# Patient Record
Sex: Female | Born: 2000
Health system: Southern US, Community
[De-identification: ages and names within clinical notes are randomized; demographics above are authoritative.]

## PROBLEM LIST (undated history)

## (undated) DIAGNOSIS — Z00129 Encounter for routine child health examination without abnormal findings: Secondary | ICD-10-CM

## (undated) DIAGNOSIS — H6691 Otitis media, unspecified, right ear: Secondary | ICD-10-CM

## (undated) HISTORY — DX: Otitis media, unspecified, right ear: H66.91

## (undated) HISTORY — DX: Encounter for routine child health examination without abnormal findings: Z00.129

---
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
## 2021-06-14 ENCOUNTER — Encounter: Payer: Commercial Managed Care - PPO | Admitting: Family Medicine

## 2021-08-04 ENCOUNTER — Ambulatory Visit (INDEPENDENT_AMBULATORY_CARE_PROVIDER_SITE_OTHER): Payer: Commercial Managed Care - PPO | Admitting: Family Medicine

## 2021-08-04 ENCOUNTER — Encounter: Payer: Self-pay | Admitting: Family Medicine

## 2021-08-04 ENCOUNTER — Other Ambulatory Visit (HOSPITAL_COMMUNITY)
Admission: RE | Admit: 2021-08-04 | Discharge: 2021-08-04 | Disposition: A | Discharge status: Home / Self Care | Payer: Commercial Managed Care - PPO | Source: Ambulatory Visit | Attending: Family Medicine | Admitting: Family Medicine

## 2021-08-04 VITALS — BP 122/76 | HR 54 | Temp 97.6°F | Ht 65.0 in | Wt 132.0 lb

## 2021-08-04 DIAGNOSIS — Z113 Encounter for screening for infections with a predominantly sexual mode of transmission: Secondary | ICD-10-CM

## 2021-08-04 DIAGNOSIS — N926 Irregular menstruation, unspecified: Secondary | ICD-10-CM

## 2021-08-04 DIAGNOSIS — Z0000 Encounter for general adult medical examination without abnormal findings: Secondary | ICD-10-CM | POA: Diagnosis not present

## 2021-08-04 LAB — IBC + FERRITIN
Ferritin: 13.8 ng/mL (ref 10.0–291.0)
Iron: 130 ug/dL (ref 42–145)
Saturation Ratios: 31.9 % (ref 20.0–50.0)
TIBC: 407.4 ug/dL (ref 250.0–450.0)
Transferrin: 291 mg/dL (ref 212.0–360.0)

## 2021-08-04 LAB — TSH: TSH: 1.07 u[IU]/mL (ref 0.35–5.50)

## 2021-08-04 LAB — CBC
HCT: 42.5 % (ref 36.0–46.0)
Hemoglobin: 13.8 g/dL (ref 12.0–15.0)
MCHC: 32.5 g/dL (ref 30.0–36.0)
MCV: 91.9 fl (ref 78.0–100.0)
Platelets: 307 10*3/uL (ref 150.0–400.0)
RBC: 4.62 Mil/uL (ref 3.87–5.11)
RDW: 12.6 % (ref 11.5–14.6)
WBC: 5.2 10*3/uL (ref 4.5–10.5)

## 2021-08-04 NOTE — Progress Notes (Signed)
? ? ?Patient ID: Jasmine Conner, female  DOB: [birth year], 21 y.o.   MRN: [id number] ?Patient Care Team  ?  Relationship Specialty Notifications Start End  ?Ma Hillock, DO PCP - General Family Medicine  07/09/15   ? Comment: wants closer location  ? ? ?Chief Complaint  ?Patient presents with  ? Annual Exam  ?  Pt is fasting  ? ? ?Subjective: ? ?Jasmine Conner is a 21 y.o.  Female  present for CPE  ?All past medical history, surgical history, allergies, family history, immunizations, medications and social history were updated in the electronic medical record today. ?All recent labs, ED visits and hospitalizations within the last year were reviewed. ? ?Health maintenance:  ?Colonoscopy:no fhx. Routine screen at 85  ?Mammogram: no fhx. routine screen at 40 ?Cervical cancer screening: Pap at 21. ?Immunizations: tdap 09/2012, Influenza  (encouraged yearly), covid completed ?Infectious disease screening: HIV and Hep C completed ?DEXA:routine screen ?Assistive device: none ?Oxygen PJK:DTOI ?Patient has a Dental home. ?Hospitalizations/ED visits: reviewed ? ?In college [employer]. Sh is l double majoring in chemistry and spanish- still wants to be a Software engineer. Leaving for study abroad in [place] for 6 weeks.  ? ? ?  08/04/2021  ?  9:28 AM 06/11/2020  ?  9:25 AM 03/09/2020  ? 10:26 AM 01/11/2019  ?  3:37 PM 01/03/2018  ?  2:27 PM  ?Depression screen PHQ 2/9  ?Decreased Interest 0 0 0 0 0  ?Down, Depressed, Hopeless 1 0 0 0 0  ?PHQ - 2 Score 1 0 0 0 0  ? ?   ? View : No data to display.  ?  ?  ?  ? ? ? ?Immunization History  ?Administered Date(s) Administered  ? DTaP 10/03/2001, 12/03/2001, 02/04/2002, 01/28/2003, 08/15/2006  ? HPV 9-valent 01/11/2019, 02/15/2019, 10/14/2019  ? Hepatitis B 02-15-02, 10/03/2001, 12/03/2001  ? HiB (PRP-OMP) 10/03/2001, 12/03/2001, 01/28/2003, 07/29/2003  ? IPV 10/03/2001, 12/03/2001, 09/20/2002, 08/15/2006  ? Influenza Split 04/19/2011  ? Influenza,inj,Quad PF,6+ Mos 12/14/2012, 04/14/2014,  01/26/2015, 01/01/2016, 01/27/2017, 01/03/2018, 12/17/2018, 03/09/2020  ? Influenza-Unspecified 01/28/2003  ? MMR 02/15/2003, 08/15/2006  ? Meningococcal Conjugate 11/04/2013  ? Meningococcal Mcv4o 01/03/2018  ? PFIZER(Purple Top)SARS-COV-2 Vaccination 08/02/2019, 08/22/2019, 03/25/2020  ? Pneumococcal Conjugate-13 10/03/2001, 12/03/2001, 05/01/2002, 02/18/2003  ? Tdap 10/15/2012  ? Varicella 09/20/2002, 08/15/2006  ? ? ? ?Past Medical History:  ?Diagnosis Date  ? ROM (right otitis media) 04/04/2012  ? ?No Known Allergies ?History reviewed. No pertinent surgical history. ?Family History  ?Problem Relation Age of Onset  ? Hepatitis Paternal Grandmother   ?     60's- hepatitis from blood transfusion  ? Hypertension Paternal Grandfather   ? Sudden death Neg Hx   ? Hyperlipidemia Neg Hx   ? Heart attack Neg Hx   ? Diabetes Neg Hx   ? ?Social History  ? ?Social History Narrative  ? Not on file  ? ? ?Allergies as of 08/04/2021   ?No Known Allergies ?  ? ?  ?Medication List  ?  ? ?  ? Accurate as of Aug 04, 2021  9:56 AM. If you have any questions, ask your nurse or doctor.  ?  ?  ? ?  ? ?STOP taking these medications   ? ?norgestimate-ethinyl estradiol 0.25-35 MG-MCG tablet ?Commonly known as: Sprintec 28 ?Stopped by: Howard Pouch, DO ?  ? ?  ? ?TAKE these medications   ? ?levocetirizine 5 MG tablet ?Commonly known as: XYZAL ?TAKE 1 TABLET BY MOUTH EVERY DAY  IN THE EVENING ?  ?mometasone 50 MCG/ACT nasal spray ?Commonly known as: NASONEX ?Place 2 sprays into the nose daily. ?  ? ?  ? ? ?All past medical history, surgical history, allergies, family history, immunizations andmedications were updated in the EMR today and reviewed under the history and medication portions of their EMR.    ? ?No results found for this or any previous visit (from the past 2160 hour(s)). ? ?No results found. ? ? ?ROS ?14 pt review of systems performed and negative (unless mentioned in an HPI) ?Objective: ?BP 122/76   Pulse (!) 54   Temp 97.6 ?F  (36.4 ?C) (Oral)   Ht $R'5\' 5"'VE$  (1.651 m)   Wt 132 lb (59.9 kg)   LMP 08/02/2021   SpO2 98%   BMI 21.97 kg/m?  ?Physical Exam ?Vitals and nursing note reviewed.  ?Constitutional:   ?   General: She is not in acute distress. ?   Appearance: Normal appearance. She is not ill-appearing or toxic-appearing.  ?HENT:  ?   Head: Normocephalic and atraumatic.  ?   Right Ear: Tympanic membrane, ear canal and external ear normal. There is no impacted cerumen.  ?   Left Ear: Tympanic membrane, ear canal and external ear normal. There is no impacted cerumen.  ?   Nose: No congestion or rhinorrhea.  ?   Mouth/Throat:  ?   Mouth: Mucous membranes are moist.  ?   Pharynx: Oropharynx is clear. No oropharyngeal exudate or posterior oropharyngeal erythema.  ?Eyes:  ?   General: No scleral icterus.    ?   Right eye: No discharge.     ?   Left eye: No discharge.  ?   Extraocular Movements: Extraocular movements intact.  ?   Conjunctiva/sclera: Conjunctivae normal.  ?   Pupils: Pupils are equal, round, and reactive to light.  ?Cardiovascular:  ?   Rate and Rhythm: Normal rate and regular rhythm.  ?   Pulses: Normal pulses.  ?   Heart sounds: Normal heart sounds. No murmur heard. ?  No friction rub. No gallop.  ?Pulmonary:  ?   Effort: Pulmonary effort is normal. No respiratory distress.  ?   Breath sounds: Normal breath sounds. No stridor. No wheezing, rhonchi or rales.  ?Chest:  ?   Chest wall: No tenderness.  ?Abdominal:  ?   General: Abdomen is flat. Bowel sounds are normal. There is no distension.  ?   Palpations: Abdomen is soft. There is no mass.  ?   Tenderness: There is no abdominal tenderness. There is no right CVA tenderness, left CVA tenderness, guarding or rebound.  ?   Hernia: No hernia is present.  ?Musculoskeletal:     ?   General: No swelling, tenderness or deformity. Normal range of motion.  ?   Cervical back: Normal range of motion and neck supple. No rigidity or tenderness.  ?   Right lower leg: No edema.  ?   Left  lower leg: No edema.  ?Lymphadenopathy:  ?   Cervical: No cervical adenopathy.  ?Skin: ?   General: Skin is warm and dry.  ?   Coloration: Skin is not jaundiced or pale.  ?   Findings: No bruising, erythema, lesion or rash.  ?Neurological:  ?   General: No focal deficit present.  ?   Mental Status: She is alert and oriented to person, place, and time. Mental status is at baseline.  ?   Cranial Nerves: No cranial nerve deficit.  ?  Sensory: No sensory deficit.  ?   Motor: No weakness.  ?   Coordination: Coordination normal.  ?   Gait: Gait normal.  ?   Deep Tendon Reflexes: Reflexes normal.  ?Psychiatric:     ?   Mood and Affect: Mood normal.     ?   Behavior: Behavior normal.     ?   Thought Content: Thought content normal.     ?   Judgment: Judgment normal.  ?  ? ?No results found. ? ?Assessment/plan: ?Tynika Luddy is a 21 y.o. female present for CPE ?Menstrual periods irregular ?Safe sex  ?- CBC ?- TSH ?- IBC + Ferritin ? ?Screen for STD (sexually transmitted disease) ?- Urine cytology ancillary only() ? ?Routine general medical examination at a health care facility ?Colonoscopy:no fhx. Routine screen at 62  ?Mammogram: no fhx. routine screen at 41 ?Cervical cancer screening: Pap at 22. ?Immunizations: tdap 09/2012, Influenza  (encouraged yearly), covid completed ?Infectious disease screening: HIV and Hep C completed ?DEXA:routine screen ?Patient was encouraged to exercise greater than 150 minutes a week. Patient was encouraged to choose a diet filled with fresh fruits and vegetables, and lean meats. ?AVS provided to patient today for education/recommendation on gender specific health and safety maintenance. ? ?Return in about 1 year (around 08/06/2022) for cpe (20 min). ? ? ?Orders Placed This Encounter  ?Procedures  ? CBC  ? TSH  ? IBC + Ferritin  ? ? ?Orders Placed This Encounter  ?Procedures  ? CBC  ? TSH  ? IBC + Ferritin  ? ?No orders of the defined types were placed in this encounter. ? ?Referral  Orders  ?No referral(s) requested today  ? ? ? ?Electronically signed by: ?Howard Pouch, DO ?[facility] ?

## 2021-08-04 NOTE — Patient Instructions (Signed)
No follow-ups on file.        Great to see you today.  I have refilled the medication(s) we provide.   If labs were collected, we will inform you of lab results once received either by echart message or telephone call.   - echart message- for normal results that have been seen by the patient already.   - telephone call: abnormal results or if patient has not viewed results in their echart.  Health Maintenance, Female Adopting a healthy lifestyle and getting preventive care are important in promoting health and wellness. Ask your health care provider about: The right schedule for you to have regular tests and exams. Things you can do on your own to prevent diseases and keep yourself healthy. What should I know about diet, weight, and exercise? Eat a healthy diet  Eat a diet that includes plenty of vegetables, fruits, low-fat dairy products, and lean protein. Do not eat a lot of foods that are high in solid fats, added sugars, or sodium. Maintain a healthy weight Body mass index (BMI) is used to identify weight problems. It estimates body fat based on height and weight. Your health care provider can help determine your BMI and help you achieve or maintain a healthy weight. Get regular exercise Get regular exercise. This is one of the most important things you can do for your health. Most adults should: Exercise for at least 150 minutes each week. The exercise should increase your heart rate and make you sweat (moderate-intensity exercise). Do strengthening exercises at least twice a week. This is in addition to the moderate-intensity exercise. Spend less time sitting. Even light physical activity can be beneficial. Watch cholesterol and blood lipids Have your blood tested for lipids and cholesterol at 20 years of age, then have this test every 5 years. Have your cholesterol levels checked more often if: Your lipid or cholesterol levels are high. You are older than 20 years of  age. You are at high risk for heart disease. What should I know about cancer screening? Depending on your health history and family history, you may need to have cancer screening at various ages. This may include screening for: Breast cancer. Cervical cancer. Colorectal cancer. Skin cancer. Lung cancer. What should I know about heart disease, diabetes, and high blood pressure? Blood pressure and heart disease High blood pressure causes heart disease and increases the risk of stroke. This is more likely to develop in people who have high blood pressure readings or are overweight. Have your blood pressure checked: Every 3-5 years if you are 18-39 years of age. Every year if you are 40 years old or older. Diabetes Have regular diabetes screenings. This checks your fasting blood sugar level. Have the screening done: Once every three years after age 40 if you are at a normal weight and have a low risk for diabetes. More often and at a younger age if you are overweight or have a high risk for diabetes. What should I know about preventing infection? Hepatitis B If you have a higher risk for hepatitis B, you should be screened for this virus. Talk with your health care provider to find out if you are at risk for hepatitis B infection. Hepatitis C Testing is recommended for: Everyone born from 1945 through 1965. Anyone with known risk factors for hepatitis C. Sexually transmitted infections (STIs) Get screened for STIs, including gonorrhea and chlamydia, if: You are sexually active and are younger than 20 years of age. You are   older than 20 years of age and your health care provider tells you that you are at risk for this type of infection. Your sexual activity has changed since you were last screened, and you are at increased risk for chlamydia or gonorrhea. Ask your health care provider if you are at risk. Ask your health care provider about whether you are at high risk for HIV. Your health  care provider may recommend a prescription medicine to help prevent HIV infection. If you choose to take medicine to prevent HIV, you should first get tested for HIV. You should then be tested every 3 months for as long as you are taking the medicine. Pregnancy If you are about to stop having your period (premenopausal) and you may become pregnant, seek counseling before you get pregnant. Take 400 to 800 micrograms (mcg) of folic acid every day if you become pregnant. Ask for birth control (contraception) if you want to prevent pregnancy. Osteoporosis and menopause Osteoporosis is a disease in which the bones lose minerals and strength with aging. This can result in bone fractures. If you are 65 years old or older, or if you are at risk for osteoporosis and fractures, ask your health care provider if you should: Be screened for bone loss. Take a calcium or vitamin D supplement to lower your risk of fractures. Be given hormone replacement therapy (HRT) to treat symptoms of menopause. Follow these instructions at home: Alcohol use Do not drink alcohol if: Your health care provider tells you not to drink. You are pregnant, may be pregnant, or are planning to become pregnant. If you drink alcohol: Limit how much you have to: 0-1 drink a day. Know how much alcohol is in your drink. In the [place] one drink equals one 12 oz bottle of beer (355 mL), one 5 oz glass of wine (148 mL), or one 1 oz glass of hard liquor (44 mL). Lifestyle Do not use any products that contain nicotine or tobacco. These products include cigarettes, chewing tobacco, and vaping devices, such as e-cigarettes. If you need help quitting, ask your health care provider. Do not use street drugs. Do not share needles. Ask your health care provider for help if you need support or information about quitting drugs. General instructions Schedule regular health, dental, and eye exams. Stay current with your vaccines. Tell your health  care provider if: You often feel depressed. You have ever been abused or do not feel safe at home. Summary Adopting a healthy lifestyle and getting preventive care are important in promoting health and wellness. Follow your health care provider's instructions about healthy diet, exercising, and getting tested or screened for diseases. Follow your health care provider's instructions on monitoring your cholesterol and blood pressure. This information is not intended to replace advice given to you by your health care provider. Make sure you discuss any questions you have with your health care provider. Document Revised: 08/03/2020 Document Reviewed: 08/03/2020 Elsevier Patient Education  2023 [employer]  

## 2021-08-05 LAB — URINE CYTOLOGY ANCILLARY ONLY
Chlamydia: NEGATIVE
Comment: NEGATIVE
Comment: NEGATIVE
Comment: NORMAL
Neisseria Gonorrhea: NEGATIVE
Trichomonas: NEGATIVE

## 2022-01-07 ENCOUNTER — Telehealth (INDEPENDENT_AMBULATORY_CARE_PROVIDER_SITE_OTHER): Payer: Commercial Managed Care - PPO | Admitting: Family Medicine

## 2022-01-07 DIAGNOSIS — F419 Anxiety disorder, unspecified: Secondary | ICD-10-CM | POA: Diagnosis not present

## 2022-01-07 MED ORDER — SERTRALINE HCL 50 MG PO TABS
50.0000 mg | ORAL_TABLET | Freq: Every day | ORAL | 1 refills | Status: DC
Start: 2022-01-07 — End: 2022-02-10

## 2022-01-07 NOTE — Progress Notes (Unsigned)
VIRTUAL VISIT VIA VIDEO  I connected with Jasmine Conner on 01/13/22 at  3:00 PM EDT by a video enabled telemedicine application and verified that I am speaking with the correct person using two identifiers. Location patient: Home Location provider: [facility], Office Persons participating in the virtual visit: Patient, Dr. Raoul Pitch and Cyndra Numbers, CMA  I discussed the limitations of evaluation and management by telemedicine and the availability of in person appointments. The patient expressed understanding and agreed to proceed.    Jasmine Conner , [birth year], 21 y.o., female MRN: [id number] Patient Care Team    Relationship Specialty Notifications Start End  Ma Hillock, DO PCP - General Family Medicine  07/09/15    Comment: wants closer location    Chief Complaint  Patient presents with   Anxiety     Subjective: Pt presents for an OV with complaints of increased anxiety.  She states anxiety is especially more prominent around studying and exam times.  She does not sleep well the night prior to exams due to anxiety.  She reports she has never been on a anxiety medication.     08/04/2021    9:28 AM 06/11/2020    9:25 AM 03/09/2020   10:26 AM 01/11/2019    3:37 PM 01/03/2018    2:27 PM  Depression screen PHQ 2/9  Decreased Interest 0 0 0 0 0  Down, Depressed, Hopeless 1 0 0 0 0  PHQ - 2 Score 1 0 0 0 0    No Known Allergies Social History   Social History Narrative   Not on file   Past Medical History:  Diagnosis Date   ROM (right otitis media) 04/04/2012   No past surgical history on file. Family History  Problem Relation Age of Onset   Hepatitis Paternal Grandmother        42's- hepatitis from blood transfusion   Hypertension Paternal Grandfather    Sudden death Neg Hx    Hyperlipidemia Neg Hx    Heart attack Neg Hx    Diabetes Neg Hx    Allergies as of 01/07/2022   No Known Allergies      Medication List        Accurate as of January 07, 2022 11:59 PM. If you have any questions, ask your nurse or doctor.          levocetirizine 5 MG tablet Commonly known as: XYZAL TAKE 1 TABLET BY MOUTH EVERY DAY IN THE EVENING   mometasone 50 MCG/ACT nasal spray Commonly known as: NASONEX Place 2 sprays into the nose daily.   sertraline 50 MG tablet Commonly known as: ZOLOFT Take 1 tablet (50 mg total) by mouth daily.        All past medical history, surgical history, allergies, family history, immunizations andmedications were updated in the EMR today and reviewed under the history and medication portions of their EMR.     ROS Negative, with the exception of above mentioned in HPI   Objective:  There were no vitals taken for this visit. There is no height or weight on file to calculate BMI. Physical Exam Vitals and nursing note reviewed.  Constitutional:      General: She is not in acute distress.    Appearance: Normal appearance. She is normal weight. She is not ill-appearing or toxic-appearing.  Eyes:     Extraocular Movements: Extraocular movements intact.     Conjunctiva/sclera: Conjunctivae normal.     Pupils: Pupils are  equal, round, and reactive to light.  Neurological:     Mental Status: She is alert and oriented to person, place, and time. Mental status is at baseline.     Comments: Anxious and tearful  Psychiatric:        Mood and Affect: Mood normal.        Behavior: Behavior normal.        Thought Content: Thought content normal.        Judgment: Judgment normal.      No results found. No results found. No results found for this or any previous visit (from the past 24 hour(s)).  Assessment/Plan: Jasmine Conner is a 21 y.o. female present for OV for  Anxiety This is a new problem for patient.   Anxiety is now negatively affecting her life, study habits and testing times. She asked for special accommodations form to be completed to allow her more time for testing.  This can be considered at a  later date, however I think she would best suited to start a low-dose medication to help her with her overall anxiety before considering test accommodations.  We discussed today test accommodations is not going to help with the anxiety she experiences surrounding the study habits and the sleeping the night prior to exams. Hopefully after starting medication she will find she is more relaxed and able to focus during her exams. Start Zoloft taper to 50 mg daily Follow-up in about 6 weeks virtually  Reviewed expectations re: course of current medical issues. Discussed self-management of symptoms. Outlined signs and symptoms indicating need for more acute intervention. Patient verbalized understanding and all questions were answered. Patient received an After-Visit Summary.    No orders of the defined types were placed in this encounter.  Meds ordered this encounter  Medications   sertraline (ZOLOFT) 50 MG tablet    Sig: Take 1 tablet (50 mg total) by mouth daily.    Dispense:  90 tablet    Refill:  1   Referral Orders  No referral(s) requested today     Note is dictated utilizing voice recognition software. Although note has been proof read prior to signing, occasional typographical errors still can be missed. If any questions arise, please do not hesitate to call for verification.   electronically signed by:  Howard Pouch, DO  [place]

## 2022-02-08 ENCOUNTER — Telehealth: Payer: Commercial Managed Care - PPO | Admitting: Family Medicine

## 2022-02-10 ENCOUNTER — Encounter: Payer: Self-pay | Admitting: Family Medicine

## 2022-02-10 ENCOUNTER — Telehealth (INDEPENDENT_AMBULATORY_CARE_PROVIDER_SITE_OTHER): Payer: Commercial Managed Care - PPO | Admitting: Family Medicine

## 2022-02-10 DIAGNOSIS — F419 Anxiety disorder, unspecified: Secondary | ICD-10-CM | POA: Insufficient documentation

## 2022-02-10 DIAGNOSIS — J302 Other seasonal allergic rhinitis: Secondary | ICD-10-CM | POA: Insufficient documentation

## 2022-02-10 MED ORDER — SERTRALINE HCL 50 MG PO TABS: 50.0000 mg | ORAL_TABLET | Freq: Every day | ORAL | 1 refills | Status: DC

## 2022-02-10 NOTE — Patient Instructions (Addendum)
Return in about 24 weeks (around 07/28/2022).        Great to see you today.  I have refilled the medication(s) we provide.   If labs were collected, we will inform you of lab results once received either by echart message or telephone call.   - echart message- for normal results that have been seen by the patient already.   - telephone call: abnormal results or if patient has not viewed results in their echart.

## 2022-02-10 NOTE — Progress Notes (Signed)
VIRTUAL VISIT VIA VIDEO  I connected with Jasmine Conner on 02/10/22 at  8:40 AM EST by a video enabled telemedicine application and verified that I am speaking with the correct person using two identifiers. Location patient: Home Location provider: [facility], Office Persons participating in the virtual visit: Patient, Dr. Raoul Pitch and Marlou Porch, CMA  I discussed the limitations of evaluation and management by telemedicine and the availability of in person appointments. The patient expressed understanding and agreed to proceed.    Jasmine Conner , May 19, 2000, 21 y.o., female MRN: [id number] Patient Care Team    Relationship Specialty Notifications Start End  Ma Hillock, DO PCP - General Family Medicine  07/09/15    Comment: wants closer location    Chief Complaint  Patient presents with   Anxiety     Subjective: Jasmine Conner  is a 21 year old patient present today via virtual platform to follow-up on her anxiety after medication start.  She was experiencing anxiety especially around studying and test anxiety.  It was causing her to miss sleep the night before tests due to stress.  She also felt that during the test she had difficulty focusing on the test secondary to stress and anxiety.  Zoloft was started and tapered up to 50 mg daily.  Today patient states she feels better and feels she can definitely see a difference since starting medication. She would like to stay on this dose. No negative side effects.  Prior note: Pt presents for an OV with complaints of increased anxiety.  She states anxiety is especially more prominent around studying and exam times.  She does not sleep well the night prior to exams due to anxiety.  She reports she has never been on a anxiety medication.     02/10/2022    8:27 AM 08/04/2021    9:28 AM 06/11/2020    9:25 AM 03/09/2020   10:26 AM 01/11/2019    3:37 PM  Depression screen PHQ 2/9  Decreased Interest 0 0 0 0 0  Down, Depressed,  Hopeless 0 1 0 0 0  PHQ - 2 Score 0 1 0 0 0  Altered sleeping 1      Tired, decreased energy 1      Change in appetite 0      Feeling bad or failure about yourself  0      Trouble concentrating 0      Moving slowly or fidgety/restless 0      Suicidal thoughts 0      PHQ-9 Score 2        No Known Allergies Social History   Social History Narrative   Not on file   Past Medical History:  Diagnosis Date   ROM (right otitis media) 04/04/2012   History reviewed. No pertinent surgical history. Family History  Problem Relation Age of Onset   Hepatitis Paternal Grandmother        40's- hepatitis from blood transfusion   Hypertension Paternal Grandfather    Sudden death Neg Hx    Hyperlipidemia Neg Hx    Heart attack Neg Hx    Diabetes Neg Hx    Allergies as of 02/10/2022   No Known Allergies      Medication List        Accurate as of February 10, 2022  8:35 AM. If you have any questions, ask your nurse or doctor.          levocetirizine 5 MG tablet  Commonly known as: XYZAL TAKE 1 TABLET BY MOUTH EVERY DAY IN THE EVENING   mometasone 50 MCG/ACT nasal spray Commonly known as: NASONEX Place 2 sprays into the nose daily.   sertraline 50 MG tablet Commonly known as: ZOLOFT Take 1 tablet (50 mg total) by mouth daily.        All past medical history, surgical history, allergies, family history, immunizations andmedications were updated in the EMR today and reviewed under the history and medication portions of their EMR.     ROS Negative, with the exception of above mentioned in HPI   Objective:  There were no vitals taken for this visit. There is no height or weight on file to calculate BMI. Physical Exam Vitals and nursing note reviewed.  Constitutional:      General: She is not in acute distress.    Appearance: Normal appearance. She is normal weight. She is not ill-appearing or toxic-appearing.  Eyes:     Extraocular Movements: Extraocular movements  intact.     Conjunctiva/sclera: Conjunctivae normal.     Pupils: Pupils are equal, round, and reactive to light.  Neurological:     Mental Status: She is alert and oriented to person, place, and time. Mental status is at baseline.  Psychiatric:        Mood and Affect: Mood normal.        Behavior: Behavior normal.        Thought Content: Thought content normal.        Judgment: Judgment normal.      No results found. No results found. No results found for this or any previous visit (from the past 24 hour(s)).  Assessment/Plan: Jasmine Conner is a 21 y.o. female present for OV for  Anxiety She has seen improvement with medication. Continue zoloft 50 mg qd Follow-up in about 5.5 mos   Reviewed expectations re: course of current medical issues. Discussed self-management of symptoms. Outlined signs and symptoms indicating need for more acute intervention. Patient verbalized understanding and all questions were answered. Patient received an After-Visit Summary.    No orders of the defined types were placed in this encounter.  Meds ordered this encounter  Medications   sertraline (ZOLOFT) 50 MG tablet    Sig: Take 1 tablet (50 mg total) by mouth daily.    Dispense:  90 tablet    Refill:  1   Referral Orders  No referral(s) requested today     Note is dictated utilizing voice recognition software. Although note has been proof read prior to signing, occasional typographical errors still can be missed. If any questions arise, please do not hesitate to call for verification.   electronically signed by:  Howard Pouch, DO  [place]

## 2022-02-22 ENCOUNTER — Encounter: Payer: Self-pay | Admitting: Family Medicine

## 2022-02-22 ENCOUNTER — Ambulatory Visit (INDEPENDENT_AMBULATORY_CARE_PROVIDER_SITE_OTHER): Payer: Commercial Managed Care - PPO | Admitting: Family Medicine

## 2022-02-22 VITALS — BP 133/89 | HR 75 | Temp 97.6°F | Wt 133.8 lb

## 2022-02-22 DIAGNOSIS — B084 Enteroviral vesicular stomatitis with exanthem: Secondary | ICD-10-CM

## 2022-02-22 NOTE — Patient Instructions (Signed)
Hand, Foot, and Mouth Disease, Adult Hand, foot, and mouth disease is a common viral illness. It happens mainly in children who are younger than 5 years, but adolescents and adults can also get it. The illness can spread easily from person to person (is contagious) and often causes: Sores in the mouth. A rash on the hands and feet. Usually, this condition is not serious. Most people get better within 1-2 weeks. What are the causes? This illness is usually caused by a group of viruses called enteroviruses. A person is most contagious during the first week of the illness. The infection spreads through direct contact with: Discharge from the nose or throat of an infected person. Stool (feces) of an infected person. Surfaces that have been contaminated. What are the signs or symptoms? Symptoms of this condition include: Small sores in the mouth. These may cause pain. A rash on the hands and feet and sometimes on the buttocks. The rash may also occur on the arms, legs, or other areas of the body. The rash may look like small red bumps or sores and may have blisters. Fever. Sore throat. Body aches or headaches. Decreased appetite. How is this diagnosed? This condition is usually diagnosed based on: A physical exam. Your health care provider will look at your rash and mouth sores. In some cases, a stool sample or a throat swab may be taken to check for the virus or for other infections. How is this treated? In most cases, no treatment is needed. People usually get better within 2 weeks. To help relieve pain or fever, your health care provider may recommend over-the-counter medicines such as ibuprofen or acetaminophen. To help relieve discomfort from mouth sores, your health care provider may recommend using: Solutions that are rinsed in the mouth. Pain-relieving gel that is applied to the sores (topical gel). Antacid medicine. Follow these instructions at home: Managing pain and  discomfort  Rinse your mouth with a mixture of salt and water 3-4 times a day or as needed. To make salt water, completely dissolve -1 tsp (3-6 g) of salt in 1 cup (237 mL) of warm water. This can help to reduce pain from the mouth sores. To relieve discomfort when you are eating: Try combinations of foods to see what you can tolerate. Aim for a balanced diet. Eat soft foods. These may be easier to swallow. Avoid foods and drinks that are salty, spicy, or acidic. Avoid alcohol. Try cold food and drinks, such as water, milk, milkshakes, frozen ice pops, slushies, and sherbets. Low-calorie sports drinks are a good choice for staying hydrated. Relieving pain, itching, and discomfort in rash areas Keep cool and out of the sun. Sweating and feeling hot can make itching worse. Cool baths can be soothing. Add baking soda or dry oatmeal to the water to reduce itching. Do not bathe in hot water. Put cold, wet cloths (cold compresses) on itchy areas, as told by your health care provider. Use calamine lotion as recommended by your health care provider. This is an over-the-counter lotion that helps to relieve itchiness. Make sure you do not scratch or pick at the rash. To help prevent scratching: Keep your fingernails clean and cut short. Wear soft gloves or mittens while sleeping if scratching is a problem. General instructions  Take or apply over-the-counter and prescription medicines only as told by your health care provider. Wash your hands often with soap and water for at least 20 seconds. If soap and water are not available, use an  alcohol-based hand sanitizer. Clean and disinfect surfaces and shared items that you frequently touch. Stay away from work, schools, or other group settings during the first few days of the illness, or until your fever is gone for at least 24 hours. Return to your normal activities as told by your health care provider. Ask your health care provider what activities are  safe for you. Keep all follow-up visits. This is important. Contact a health care provider if: Your symptoms get worse or do not improve within 2 weeks. You have pain that does not get better with medicine. You have trouble swallowing. You develop sores or blisters on your lips or outside of your mouth. You have a fever for more than 3 days. Get help right away if: You develop signs of severe dehydration, such as: Decreased urination. This means urinating only very small amounts or fewer than 3 times in a 24-hour period. Urine that is very dark. Dry mouth, tongue, or lips. Decreased tears or sunken eyes. Dry skin. Rapid breathing. Decreased activity or being very sleepy. Pale skin. Your fingertips take longer than 2 seconds to turn pink after a gentle squeeze. Weight loss. You have a severe headache. You have a stiff neck. You have changes in your behavior. You have chest pain or trouble breathing. These symptoms may represent a serious problem that is an emergency. Do not wait to see if the symptoms will go away. Get medical help right away. Call your local emergency services (911 in the [place] Do not drive yourself to the hospital. Summary Hand, foot, and mouth disease is a common viral illness. This disease can spread easily from person to person (is contagious). The illness often causes sores in the mouth, a rash on the hands and feet, a fever, and a sore throat. Typically, no treatment is needed for this condition. People usually get better within 2 weeks. Get help right away if you develop signs of severe dehydration. This information is not intended to replace advice given to you by your health care provider. Make sure you discuss any questions you have with your health care provider. Document Revised: 12/16/2019 Document Reviewed: 12/16/2019 Elsevier Patient Education  [place].

## 2022-02-22 NOTE — Progress Notes (Signed)
Jasmine Conner , [birth year], 21 y.o., female MRN: [id number] Patient Care Team    Relationship Specialty Notifications Start End  Ma Hillock, DO PCP - General Family Medicine  07/09/15    Comment: wants closer location    Chief Complaint  Patient presents with   Rash    Hfm/ ER dx 11/24     Subjective: Pt presents for an OV with complaints of rash on her palms and soles of feet.  She reports she had a sore throat 1 week ago with a low-grade fever.  2 days later she broke out in a rash on her face, palms and feet.  She states the rash on her palms and feet are quite painful.  She has been placing triamcinolone cream over these areas, which she received from a telehealth visit.  She then went to the emergency room the next day, and for her to was having an allergic reaction.  They diagnosed her with hand/foot/mouth disease.     02/10/2022    8:27 AM 08/04/2021    9:28 AM 06/11/2020    9:25 AM 03/09/2020   10:26 AM 01/11/2019    3:37 PM  Depression screen PHQ 2/9  Decreased Interest 0 0 0 0 0  Down, Depressed, Hopeless 0 1 0 0 0  PHQ - 2 Score 0 1 0 0 0  Altered sleeping 1      Tired, decreased energy 1      Change in appetite 0      Feeling bad or failure about yourself  0      Trouble concentrating 0      Moving slowly or fidgety/restless 0      Suicidal thoughts 0      PHQ-9 Score 2        No Known Allergies Social History   Social History Narrative   Not on file   Past Medical History:  Diagnosis Date   ROM (right otitis media) 04/04/2012   History reviewed. No pertinent surgical history. Family History  Problem Relation Age of Onset   Hepatitis Paternal Grandmother        10's- hepatitis from blood transfusion   Hypertension Paternal Grandfather    Sudden death Neg Hx    Hyperlipidemia Neg Hx    Heart attack Neg Hx    Diabetes Neg Hx    Allergies as of 02/22/2022   No Known Allergies      Medication List        Accurate as of February 22, 2022  2:44 PM. If you have any questions, ask your nurse or doctor.          levocetirizine 5 MG tablet Commonly known as: XYZAL TAKE 1 TABLET BY MOUTH EVERY DAY IN THE EVENING   mometasone 50 MCG/ACT nasal spray Commonly known as: NASONEX Place 2 sprays into the nose daily.   naproxen 500 MG tablet Commonly known as: NAPROSYN Take by mouth.   sertraline 50 MG tablet Commonly known as: ZOLOFT Take 1 tablet (50 mg total) by mouth daily.   triamcinolone ointment 0.1 % Commonly known as: KENALOG Apply topically 2 (two) times daily.   valACYclovir 1000 MG tablet Commonly known as: VALTREX Take 1,000 mg by mouth 2 (two) times daily.        All past medical history, surgical history, allergies, family history, immunizations andmedications were updated in the EMR today and reviewed under the history and medication portions of their EMR.  ROS Negative, with the exception of above mentioned in HPI   Objective:  BP 133/89   Pulse 75   Temp 97.6 F (36.4 C)   Wt 133 lb 12.8 oz (60.7 kg)   BMI 22.27 kg/m  Body mass index is 22.27 kg/m. Physical Exam Vitals and nursing note reviewed.  Constitutional:      General: She is not in acute distress.    Appearance: Normal appearance. She is normal weight. She is not ill-appearing or toxic-appearing.  Eyes:     Extraocular Movements: Extraocular movements intact.     Conjunctiva/sclera: Conjunctivae normal.     Pupils: Pupils are equal, round, and reactive to light.  Skin:    Findings: Rash (Maculopapular rash bilateral hands and bilateral feet.  Vesicles on the feet rash.) present.  Neurological:     Mental Status: She is alert and oriented to person, place, and time. Mental status is at baseline.  Psychiatric:        Mood and Affect: Mood normal.        Behavior: Behavior normal.        Thought Content: Thought content normal.        Judgment: Judgment normal.     No results found. No results found. No results  found for this or any previous visit (from the past 24 hour(s)).  Assessment/Plan: Jasmine Conner is a 21 y.o. female present for OV for  Hand, foot and mouth disease Agree, rash is consistent with hand-foot-and-mouth disease.  Her throat symptoms have resolved and she is eating and drinking her normal. Encouraged her to purchase the lidocaine cream/gel OTC to help with the discomfort on her hands and feet. Assured patient this should resolve over the next 2-4 days.  Reviewed expectations re: course of current medical issues. Discussed self-management of symptoms. Outlined signs and symptoms indicating need for more acute intervention. Patient verbalized understanding and all questions were answered. Patient received an After-Visit Summary.    No orders of the defined types were placed in this encounter.  No orders of the defined types were placed in this encounter.  Referral Orders  No referral(s) requested today     Note is dictated utilizing voice recognition software. Although note has been proof read prior to signing, occasional typographical errors still can be missed. If any questions arise, please do not hesitate to call for verification.   electronically signed by:  Howard Pouch, DO  [place]

## 2022-03-29 ENCOUNTER — Telehealth: Payer: Commercial Managed Care - PPO | Admitting: Family Medicine

## 2022-03-29 DIAGNOSIS — R6889 Other general symptoms and signs: Secondary | ICD-10-CM | POA: Diagnosis not present

## 2022-03-29 MED ORDER — OSELTAMIVIR PHOSPHATE 75 MG PO CAPS: 75.0000 mg | ORAL_CAPSULE | Freq: Two times a day (BID) | ORAL | 0 refills | Status: AC

## 2022-03-29 NOTE — Progress Notes (Signed)
E visit for Flu like symptoms   We are sorry that you are not feeling well.  Here is how we plan to help! Based on what you have shared with me it looks like you may have possible exposure to a virus that causes influenza.  Influenza or "the flu" is   an infection caused by a respiratory virus. The flu virus is highly contagious and persons who did not receive their yearly flu vaccination may "catch" the flu from close contact.  We have anti-viral medications to treat the viruses that cause this infection. They are not a "cure" and only shorten the course of the infection. These prescriptions are most effective when they are given within the first 2 days of "flu" symptoms. Antiviral medication are indicated if you have a high risk of complications from the flu. You should  also consider an antiviral medication if you are in close contact with someone who is at risk. These medications can help patients avoid complications from the flu  but have side effects that you should know. Possible side effects from Tamiflu or oseltamivir include nausea, vomiting, diarrhea, dizziness, headaches, eye redness, sleep problems or other respiratory symptoms. You should not take Tamiflu if you have an allergy to oseltamivir or any to the ingredients in Tamiflu.  Based upon your symptoms and potential risk factors I have prescribed Oseltamivir (Tamiflu).  It has been sent to your designated pharmacy.  You will take one 75 mg capsule orally twice a day for the next 5 days. and I recommend that you follow the flu symptoms recommendation that I have listed below.  ANYONE WHO HAS FLU SYMPTOMS SHOULD: Stay home. The flu is highly contagious and going out or to work exposes others! Be sure to drink plenty of fluids. Water is fine as well as fruit juices, sodas and electrolyte beverages. You may want to stay away from caffeine or alcohol. If you are nauseated, try taking small sips of liquids. How do you know if you are getting  enough fluid? Your urine should be a pale yellow or almost colorless. Get rest. Taking a steamy shower or using a humidifier may help nasal congestion and ease sore throat pain. Using a saline nasal spray works much the same way. Cough drops, hard candies and sore throat lozenges may ease your cough. Line up a caregiver. Have someone check on you regularly.   GET HELP RIGHT AWAY IF: You cannot keep down liquids or your medications. You become short of breath Your fell like you are going to pass out or loose consciousness. Your symptoms persist after you have completed your treatment plan MAKE SURE YOU  Understand these instructions. Will watch your condition. Will get help right away if you are not doing well or get worse.  Your e-visit answers were reviewed by a board certified advanced clinical practitioner to complete your personal care plan.  Depending on the condition, your plan could have included both over the counter or prescription medications.  If there is a problem please reply  once you have received a response from your provider.  Your safety is important to [place].  If you have drug allergies check your prescription carefully.    You can use MyChart to ask questions about today's visit, request a non-urgent call back, or ask for a work or school excuse for 24 hours related to this e-Visit. If it has been greater than 24 hours you will need to follow up with your provider, or enter  a new e-Visit to address those concerns.  You will get an e-mail in the next two days asking about your experience.  I hope that your e-visit has been valuable and will speed your recovery. Thank you for using e-visits.  I provided 5 minutes of non face-to-face time during this encounter for chart review, medication and order placement, as well as and documentation.

## 2022-07-15 ENCOUNTER — Telehealth: Payer: Commercial Managed Care - PPO | Admitting: Family Medicine

## 2022-10-05 ENCOUNTER — Other Ambulatory Visit: Payer: Self-pay | Admitting: Family Medicine

## 2022-10-18 ENCOUNTER — Telehealth (INDEPENDENT_AMBULATORY_CARE_PROVIDER_SITE_OTHER): Payer: Commercial Managed Care - PPO | Admitting: Family Medicine

## 2022-10-18 ENCOUNTER — Encounter: Payer: Self-pay | Admitting: Family Medicine

## 2022-10-18 DIAGNOSIS — F419 Anxiety disorder, unspecified: Secondary | ICD-10-CM | POA: Diagnosis not present

## 2022-10-18 MED ORDER — SERTRALINE HCL 50 MG PO TABS: 50.0000 mg | ORAL_TABLET | Freq: Every day | ORAL | 1 refills | Status: AC

## 2022-10-18 NOTE — Patient Instructions (Addendum)
   Return in about 24 weeks (around 04/04/2023) for Routine chronic condition follow-up.        Great to see you today.  I have refilled the medication(s) we provide.   If labs were collected, we will inform you of lab results once received either by echart message or telephone call.   - echart message- for normal results that have been seen by the patient already.   - telephone call: abnormal results or if patient has not viewed results in their echart.

## 2022-10-18 NOTE — Progress Notes (Signed)
VIRTUAL VISIT VIA VIDEO  I connected with Jasmine Conner on 10/18/22 at  8:40 AM EDT by a video enabled telemedicine application and verified that I am speaking with the correct person using two identifiers. Location patient: Home Location provider: [facility], Office Persons participating in the virtual visit: Patient, Dr. Claiborne Billings and Ivonne Andrew, CMA  I discussed the limitations of evaluation and management by telemedicine and the availability of in person appointments. The patient expressed understanding and agreed to proceed.     Jasmine Conner , 2001/02/10, 22 y.o., female MRN: [id number] Patient Care Team    Relationship Specialty Notifications Start End  Natalia Leatherwood, DO PCP - General Family Medicine  07/09/15    Comment: wants closer location    Chief Complaint  Patient presents with   Anxiety     Subjective: Jasmine Conner  is a 22 year old patient present today follow-up on her anxiety   Anxiety: She had been experiencing anxiety especially around studying and test anxiety.  It was causing her to miss sleep the night before tests due to stress.  She also felt that during the test she had difficulty focusing on the test secondary to stress and anxiety.  Zoloft was started and tapered up to 50 mg daily.  Today patient states she feels medication is still working well for her at current dose. She has had a busy summer with studies. Prior note: Pt presents for an OV with complaints of increased anxiety.  She states anxiety is especially more prominent around studying and exam times.  She does not sleep well the night prior to exams due to anxiety.  She reports she has never been on a anxiety medication.     10/18/2022    8:28 AM 02/10/2022    8:27 AM 08/04/2021    9:28 AM 06/11/2020    9:25 AM 03/09/2020   10:26 AM  Depression screen PHQ 2/9  Decreased Interest 0 0 0 0 0  Down, Depressed, Hopeless 0 0 1 0 0  PHQ - 2 Score 0 0 1 0 0  Altered sleeping  1      Tired, decreased energy  1     Change in appetite  0     Feeling bad or failure about yourself   0     Trouble concentrating  0     Moving slowly or fidgety/restless  0     Suicidal thoughts  0     PHQ-9 Score  2       No Known Allergies Social History   Social History Narrative   Not on file   Past Medical History:  Diagnosis Date   ROM (right otitis media) 04/04/2012   History reviewed. No pertinent surgical history. Family History  Problem Relation Age of Onset   Hepatitis Paternal Grandmother        78's- hepatitis from blood transfusion   Hypertension Paternal Grandfather    Sudden death Neg Hx    Hyperlipidemia Neg Hx    Heart attack Neg Hx    Diabetes Neg Hx    Allergies as of 10/18/2022   No Known Allergies      Medication List        Accurate as of October 18, 2022  8:37 AM. If you have any questions, ask your nurse or doctor.          levocetirizine 5 MG tablet Commonly known as: XYZAL TAKE 1 TABLET BY MOUTH  EVERY DAY IN THE EVENING   mometasone 50 MCG/ACT nasal spray Commonly known as: NASONEX Place 2 sprays into the nose daily.   naproxen 500 MG tablet Commonly known as: NAPROSYN Take by mouth.   sertraline 50 MG tablet Commonly known as: ZOLOFT Take 1 tablet (50 mg total) by mouth daily.   triamcinolone ointment 0.1 % Commonly known as: KENALOG Apply topically 2 (two) times daily.   valACYclovir 1000 MG tablet Commonly known as: VALTREX Take 1,000 mg by mouth 2 (two) times daily.        All past medical history, surgical history, allergies, family history, immunizations andmedications were updated in the EMR today and reviewed under the history and medication portions of their EMR.     ROS Negative, with the exception of above mentioned in HPI   Objective:  LMP 10/14/2022  There is no height or weight on file to calculate BMI. Physical Exam Vitals and nursing note reviewed.  Constitutional:      General: She is not in  acute distress.    Appearance: Normal appearance. She is normal weight. She is not ill-appearing or toxic-appearing.  Eyes:     Extraocular Movements: Extraocular movements intact.     Conjunctiva/sclera: Conjunctivae normal.     Pupils: Pupils are equal, round, and reactive to light.  Neurological:     Mental Status: She is alert and oriented to person, place, and time. Mental status is at baseline.  Psychiatric:        Mood and Affect: Mood normal.        Behavior: Behavior normal.        Thought Content: Thought content normal.        Judgment: Judgment normal.      No results found. No results found. No results found for this or any previous visit (from the past 24 hour(s)).  Assessment/Plan: Jasmine Conner is a 22 y.o. female present for OV for  Anxiety stable Contiue zoloft 50 mg qd Follow-up in about 5.5 mos   Reviewed expectations re: course of current medical issues. Discussed self-management of symptoms. Outlined signs and symptoms indicating need for more acute intervention. Patient verbalized understanding and all questions were answered. Patient received an After-Visit Summary.    No orders of the defined types were placed in this encounter.  No orders of the defined types were placed in this encounter.  Referral Orders  No referral(s) requested today     Note is dictated utilizing voice recognition software. Although note has been proof read prior to signing, occasional typographical errors still can be missed. If any questions arise, please do not hesitate to call for verification.   electronically signed by:  Felix Pacini, DO  [facility] [place]

## 2023-04-15 ENCOUNTER — Other Ambulatory Visit: Payer: Self-pay | Admitting: Family Medicine

## 2023-06-29 ENCOUNTER — Ambulatory Visit: Payer: Commercial Managed Care - PPO | Admitting: Family Medicine
